# Patient Record
Sex: Female | Born: 1975 | Hispanic: No | Marital: Single | Smoking: Never smoker
Health system: Southern US, Community
[De-identification: ages and names within clinical notes are randomized; demographics above are authoritative.]

---

## 2007-06-18 ENCOUNTER — Other Ambulatory Visit: Admission: RE | Admit: 2007-06-18 | Discharge: 2007-06-18 | Payer: Self-pay | Admitting: Family Medicine

## 2012-02-12 ENCOUNTER — Encounter (INDEPENDENT_AMBULATORY_CARE_PROVIDER_SITE_OTHER): Payer: Self-pay

## 2016-01-18 ENCOUNTER — Ambulatory Visit (INDEPENDENT_AMBULATORY_CARE_PROVIDER_SITE_OTHER): Payer: BLUE CROSS/BLUE SHIELD | Admitting: Sports Medicine

## 2016-01-18 ENCOUNTER — Encounter: Payer: Self-pay | Admitting: Sports Medicine

## 2016-01-18 VITALS — BP 114/63 | Ht 61.0 in | Wt 110.0 lb

## 2016-01-18 DIAGNOSIS — M7918 Myalgia, other site: Secondary | ICD-10-CM

## 2016-01-18 DIAGNOSIS — M791 Myalgia: Secondary | ICD-10-CM | POA: Diagnosis not present

## 2016-01-18 NOTE — Progress Notes (Signed)
Patient ID: Erica Gilbert Boniface, female   DOB: 06/23/1976, 40 y.o.   MRN: [id number]  CC:  HPI: Erica Gilbert Mallinger is a 40 y.o. yo female with noncontributory PMH presenting with one year of R buttock and low back pain.   Pt states for past year has noticed pain deep in her R buttock, low R back, and intermittent shooting pain/numbness down the back of her R leg to her knee for the first several months only.  Pain is mild, only comes on when running or sitting for prolonged period.  Pain-free at rest.  Is a runner, about 10 miles per week, has not had to decrease this amount 2/2 pain.  Does not recall any specific injury.  Motrin has helped, as has PT manipulation and laser therapy early in the course of the pain.  Denies weakness, has never had this happen before.  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 yo female sitting one exam table in NAD HEENT: normocephalic, atraumatic Pulm: normal work of breathing MSK:  RLE: inspection of hip, knee, ankle surface anatomy and skin wnl, non-TTP throughout, full active ROM, DTRs 2+, strength 5/5 on hip abduction, flexion, knee extension/flexion, great toe extension, FABER negative Back: inspection reveals normal degree of lumbar lordosis, non-TTP throughout, full active ROM of spine, pain in buttocks  brought on with eye of needle stretch, R hip internal rotation TTP is only in deep buttocks with stretches is worse  Gait/Run: unhindered gait with R foot out-turned 5 degrees, same with running as well as slight R foot pronation  A/P: Erica Gilbert Milholland is a 40 y.o. yo female presenting with R piriformis syndrome.  R piriformis syndrome Diagnosed based on chronic nature of symptoms, pain going down leg, and out-turned R foot during gait/running.  Highly unlike discogenic given full painless ROM of  back -- Counseled pt that this may take a while to correct -- Gave several home stretches to alleviate tension on R piriformis -- Advised to buy rubber spiky ball to massage deep buttock muscles on R side -- Line drills to help train to run with feet in straight line -- Provided scaphoid pad to bilateral shoe insoles for support and to correct R foot pronation -- If symptoms worsen, call office  Follow-up: PRN  Jalexus Brett, MS4  This will likely take 3 mos or so to correct but key is HEP and change in running form.  Sterling BigKB Fields, MD

## 2016-01-18 NOTE — Patient Instructions (Signed)
Recommendations:   1) Spiky ball massage against wall  2) Pigeon toe walk  3) Line drills after running  4) R foot forward, bend forward with both hands and touch great toe  5) Standing straight up, flex hip 90 degrees and swing inward across midline and out laterally, back and forth 10 times  6) Lying on back, eye of needle stretch (R leg over L knee and bring R knee to chest to stretch R buttock)

## 2016-01-21 DIAGNOSIS — M7918 Myalgia, other site: Secondary | ICD-10-CM | POA: Insufficient documentation

## 2016-01-21 NOTE — Assessment & Plan Note (Signed)
See HEP Gait retraining  As per OV note

## 2020-11-09 ENCOUNTER — Other Ambulatory Visit (HOSPITAL_COMMUNITY)
Admission: RE | Admit: 2020-11-09 | Discharge: 2020-11-09 | Disposition: A | Discharge status: Home / Self Care | Payer: PRIVATE HEALTH INSURANCE | Source: Ambulatory Visit | Attending: Family Medicine | Admitting: Family Medicine

## 2020-11-09 ENCOUNTER — Other Ambulatory Visit: Payer: Self-pay | Admitting: Family Medicine

## 2020-11-09 DIAGNOSIS — Z01411 Encounter for gynecological examination (general) (routine) with abnormal findings: Secondary | ICD-10-CM | POA: Diagnosis not present

## 2020-11-10 ENCOUNTER — Other Ambulatory Visit (HOSPITAL_COMMUNITY): Payer: Self-pay | Admitting: Emergency Medicine

## 2020-11-10 LAB — CYTOLOGY - PAP
Comment: NEGATIVE
Diagnosis: NEGATIVE
High risk HPV: NEGATIVE

## 2020-11-13 ENCOUNTER — Other Ambulatory Visit (HOSPITAL_COMMUNITY): Payer: Self-pay | Admitting: Emergency Medicine

## 2020-11-16 ENCOUNTER — Other Ambulatory Visit: Payer: Self-pay

## 2020-11-16 ENCOUNTER — Ambulatory Visit (HOSPITAL_BASED_OUTPATIENT_CLINIC_OR_DEPARTMENT_OTHER)
Admission: RE | Admit: 2020-11-16 | Discharge: 2020-11-16 | Disposition: A | Discharge status: Home / Self Care | Payer: BLUE CROSS/BLUE SHIELD | Source: Ambulatory Visit | Attending: Cardiology | Admitting: Cardiology

## 2021-05-10 ENCOUNTER — Other Ambulatory Visit (HOSPITAL_BASED_OUTPATIENT_CLINIC_OR_DEPARTMENT_OTHER): Payer: Self-pay

## 2021-05-10 ENCOUNTER — Ambulatory Visit: Payer: BLUE CROSS/BLUE SHIELD | Attending: Internal Medicine

## 2021-05-10 DIAGNOSIS — Z23 Encounter for immunization: Secondary | ICD-10-CM

## 2021-05-10 MED ORDER — PFIZER COVID-19 VAC BIVALENT 30 MCG/0.3ML IM SUSP
INTRAMUSCULAR | 0 refills | Status: AC
  Filled 2021-05-10: qty 0.3, 1d supply, fill #0

## 2021-05-10 NOTE — Progress Notes (Signed)
   Covid-19 Vaccination Clinic  Name:  Adelma Bowdoin    MRN: [id number] DOB: 1976-08-04  05/10/2021  Ms. Ambrocio was observed post Covid-19 immunization for 15 minutes without incident. She was provided with Vaccine Information Sheet and instruction to access the V-Safe system.   Ms. Browning was instructed to call 911 with any severe reactions post vaccine: Difficulty breathing  Swelling of face and throat  A fast heartbeat  A bad rash all over body  Dizziness and weakness

## 2022-03-22 IMAGING — CT CT CARDIAC CORONARY ARTERY CALCIUM SCORE
3 series · 14 of 20 positions shown, 16 images · non-contrast
Comparison: None.
COMPARISON: None.

Addendum:
EXAM:
OVER-READ INTERPRETATION  CT CHEST

The following report is an over-read performed by radiologist Dr.
Rtoyota Joshjax [REDACTED] on 11/16/2020. This
over-read does not include interpretation of cardiac or coronary
anatomy or pathology. The coronary calcium score interpretation by
the cardiologist is attached.
CLINICAL DATA: Cardiovascular Disease Risk stratification
Coronary Calcium Score
TECHNIQUE: A gated, non-contrast computed tomography scan of the heart was
performed using 3mm slice thickness. Axial images were analyzed on a
dedicated workstation. Calcium scoring of the coronary arteries was
performed using the Agatston method.

[Series 2: casc 3.0 best diast 70 % (id) · axial · 0.39mm/px · z∈[+1260,+1332]mm · 4 of 40 slices shown]
[im 8/40  vessel]
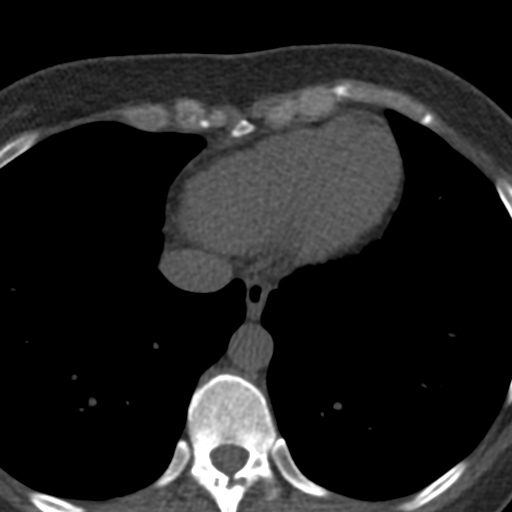
[im 16/40  vessel]
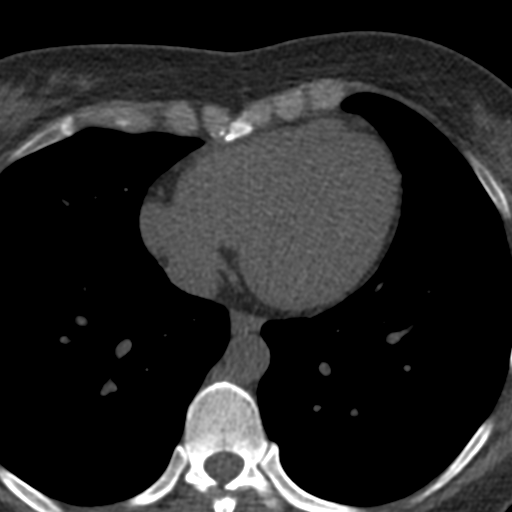
[im 24/40  vessel]
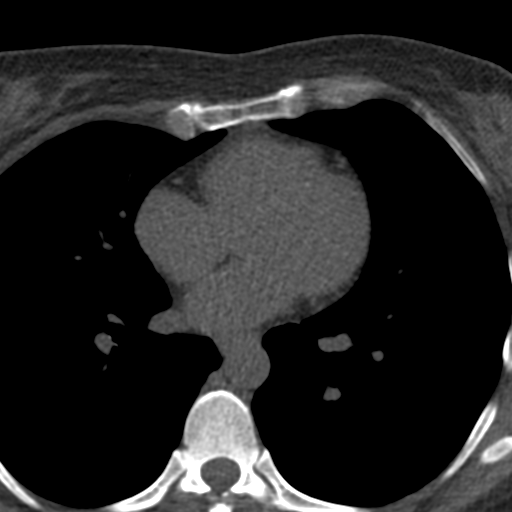
[im 32/40  vessel]
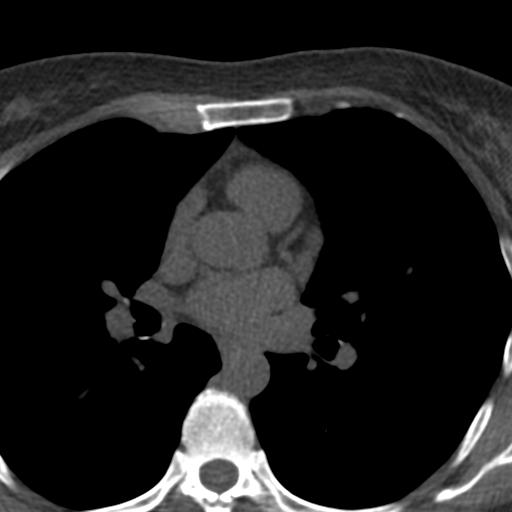

[Series 3: soft full fov 70 % · axial · 0.56mm/px · z∈[+1257,+1335]mm · 5 of 40 slices shown, 7 images]
[im 7/40  vessel]
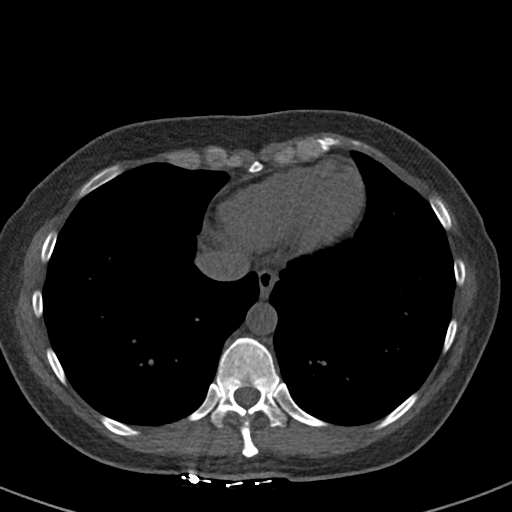
[im 7/40  lung]
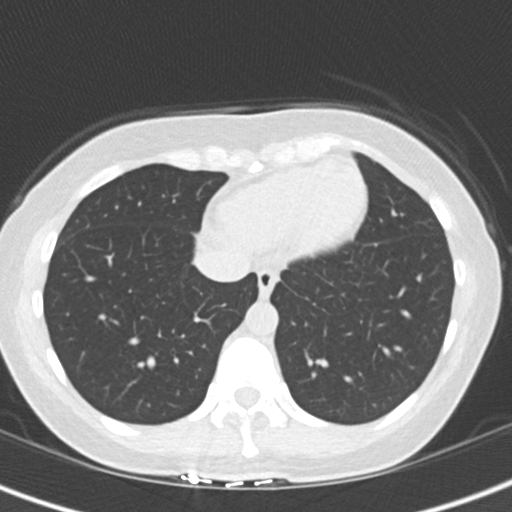
[im 14/40  vessel]
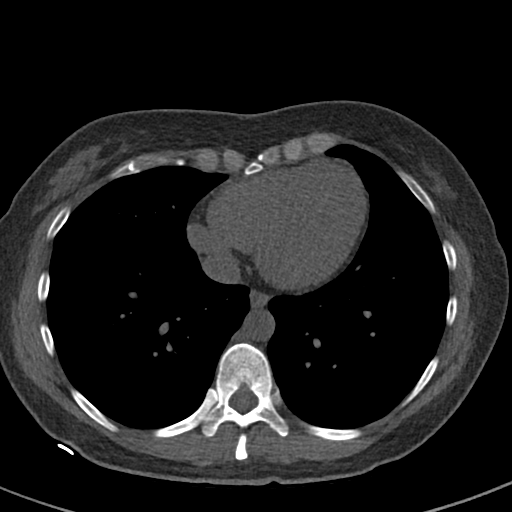
[im 20/40  vessel]
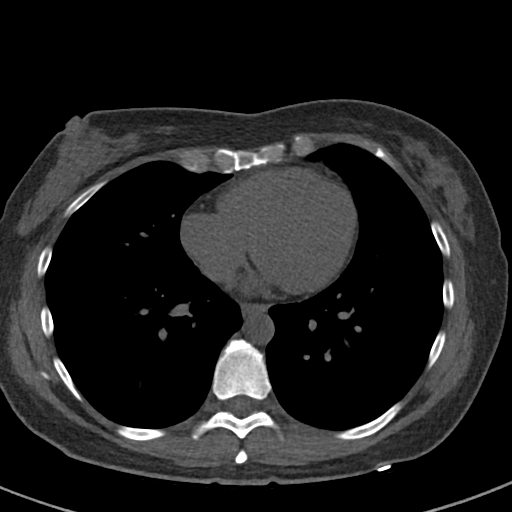
[im 27/40  vessel]
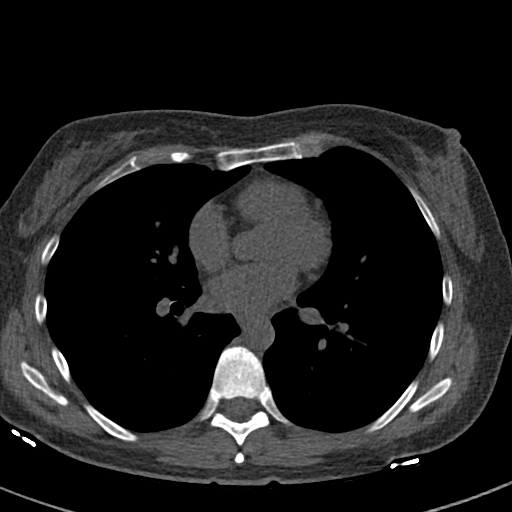
[im 33/40  vessel]
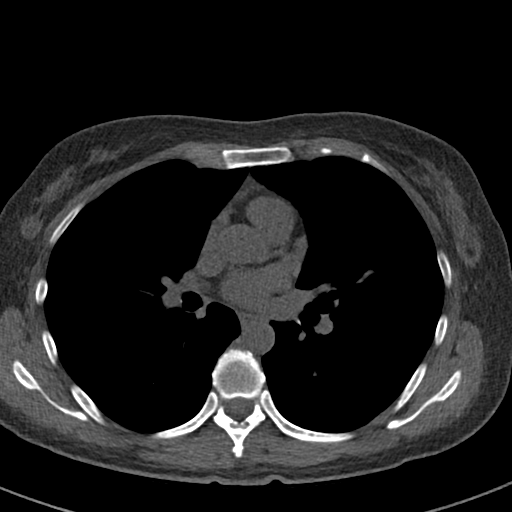
[im 33/40  lung]
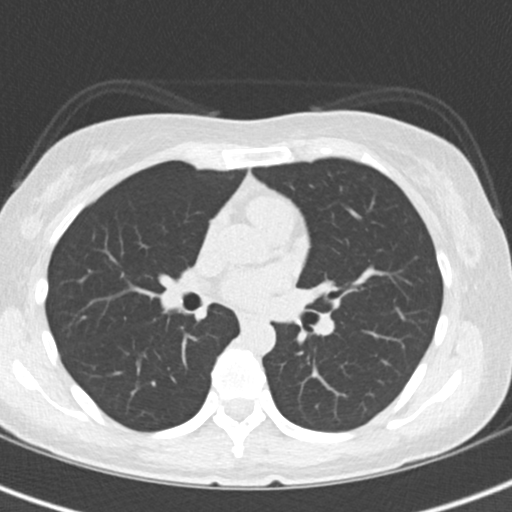

[Series 4: lungs 70 % · axial · 0.56mm/px · z∈[+1257,+1335]mm · 5 of 40 slices shown]
[im 7/40  vessel]
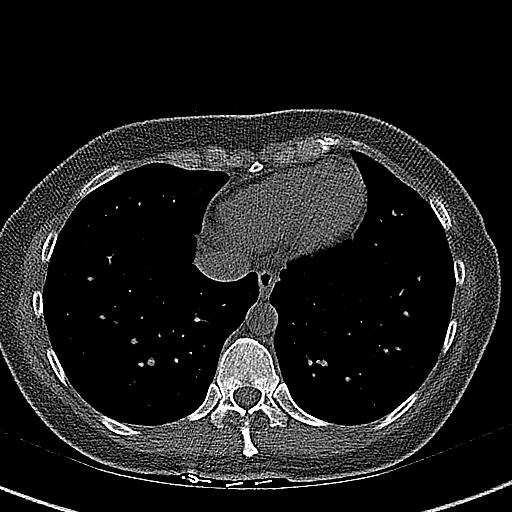
[im 14/40  vessel]
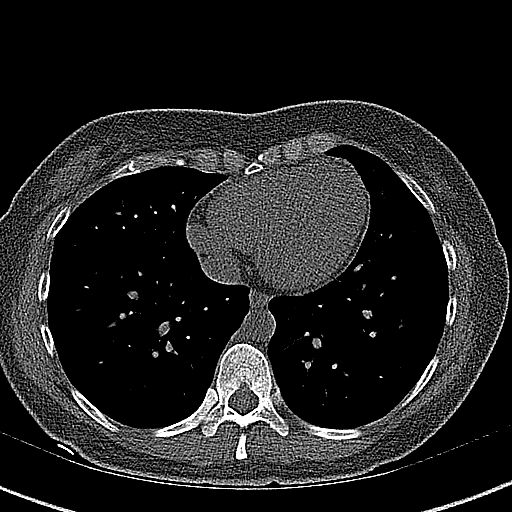
[im 20/40  vessel]
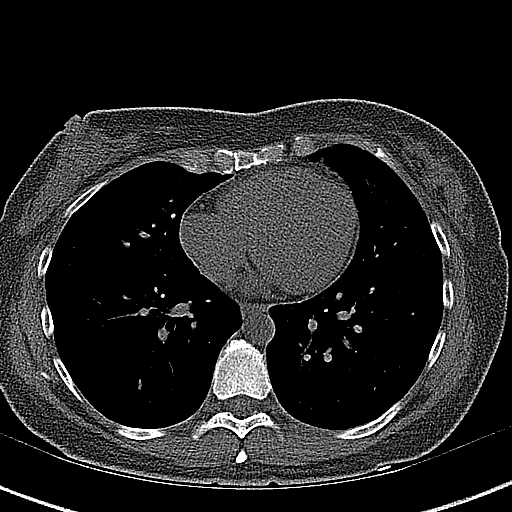
[im 27/40  vessel]
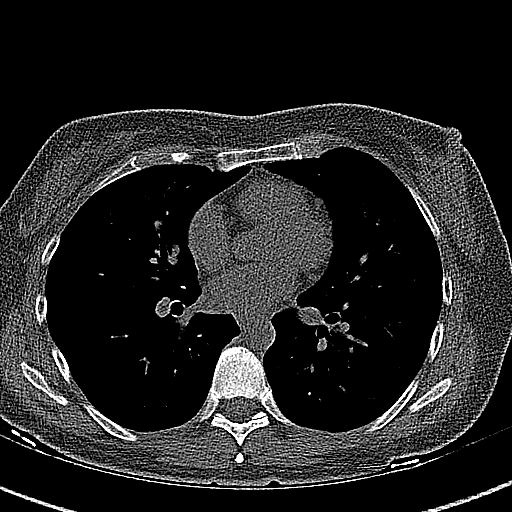
[im 33/40  vessel]
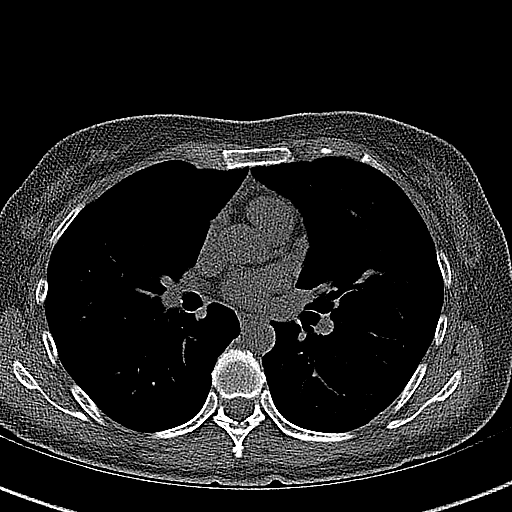

[14 of 20 positions shown; findings below may reference images not displayed]

FINDINGS: Vascular: No significant noncardiac vascular findings.

Mediastinum/Nodes: Visualized mediastinum and hilar regions
demonstrate no lymphadenopathy or masses.

Lungs/Pleura: Visualized lungs show no evidence of pulmonary edema,
consolidation, pneumothorax, nodule or pleural fluid.

Upper Abdomen: No acute abnormality.

Musculoskeletal: No chest wall mass or suspicious bone lesions
identified.
IMPRESSION: No significant incidental findings.
FINDINGS: Coronary Calcium Score:

Left main: 0

Left anterior descending artery: 0

Left circumflex artery: 0

Right coronary artery: 0

Total: 0

Percentile: NA

Pericardium: Normal.

Ascending Aorta: Normal caliber.

Non-cardiac: See separate report from [REDACTED].
IMPRESSION: Coronary calcium score of 0.



If CAC=0, it is reasonable to withhold statin therapy and reassess
in 5 to 10 years, as long as higher risk conditions are absent
(diabetes mellitus, family history of premature CHD in first degree
relatives (males <55 years; females <65 years), cigarette smoking,
or LDL >=190 mg/dL).

If CAC is 1 to 99, it is reasonable to initiate statin therapy for
patients >=55 years of age.

If CAC is >=100 or >=75th percentile, it is reasonable to initiate
statin therapy at any age.

Cardiology referral should be considered for patients with CAC
scores >=400 or >=75th percentile.

*1817 AHA/ACC/AACVPR/AAPA/ABC/MONGE GARAY/FABIEN/KARPINSKI/Gabrielito/LUXON/TENGUAN/BOROJEVIC
Guideline on the Management of Blood Cholesterol: A Report of the
American College of Cardiology/American Heart Association Task Force
on Clinical Practice Guidelines. J Am Coll Cardiol.
8646;73(24):3713-3164.

*** End of Addendum ***
EXAM:
OVER-READ INTERPRETATION  CT CHEST

The following report is an over-read performed by radiologist Dr.
Rtoyota Joshjax [REDACTED] on 11/16/2020. This
over-read does not include interpretation of cardiac or coronary
anatomy or pathology. The coronary calcium score interpretation by
the cardiologist is attached.
FINDINGS: Vascular: No significant noncardiac vascular findings.

Mediastinum/Nodes: Visualized mediastinum and hilar regions
demonstrate no lymphadenopathy or masses.

Lungs/Pleura: Visualized lungs show no evidence of pulmonary edema,
consolidation, pneumothorax, nodule or pleural fluid.

Upper Abdomen: No acute abnormality.

Musculoskeletal: No chest wall mass or suspicious bone lesions
identified.
IMPRESSION: No significant incidental findings.
